# Patient Record
Sex: Female | Born: 1965 | Race: Black or African American | Hispanic: No | Marital: Single | Smoking: Never smoker
Health system: Southern US, Community
[De-identification: ages and names within clinical notes are randomized; demographics above are authoritative.]

## PROBLEM LIST (undated history)

## (undated) DIAGNOSIS — I10 Essential (primary) hypertension: Secondary | ICD-10-CM

## (undated) DIAGNOSIS — E079 Disorder of thyroid, unspecified: Secondary | ICD-10-CM

## (undated) DIAGNOSIS — C801 Malignant (primary) neoplasm, unspecified: Secondary | ICD-10-CM

## (undated) HISTORY — PX: THYROID SURGERY: SHX805

## (undated) HISTORY — PX: ABDOMINAL HYSTERECTOMY: SHX81

---

## 1998-04-27 ENCOUNTER — Emergency Department (HOSPITAL_COMMUNITY): Admission: EM | Admit: 1998-04-27 | Discharge: 1998-04-27 | Payer: Self-pay | Admitting: Emergency Medicine

## 2001-05-12 ENCOUNTER — Emergency Department (HOSPITAL_COMMUNITY): Admission: EM | Admit: 2001-05-12 | Discharge: 2001-05-12 | Payer: Self-pay | Admitting: *Deleted

## 2001-05-12 ENCOUNTER — Encounter: Payer: Self-pay | Admitting: *Deleted

## 2005-04-25 ENCOUNTER — Emergency Department (HOSPITAL_COMMUNITY): Admission: EM | Admit: 2005-04-25 | Discharge: 2005-04-25 | Payer: Self-pay | Admitting: Emergency Medicine

## 2015-10-07 ENCOUNTER — Encounter (HOSPITAL_BASED_OUTPATIENT_CLINIC_OR_DEPARTMENT_OTHER): Payer: Self-pay | Admitting: Emergency Medicine

## 2015-10-07 ENCOUNTER — Emergency Department (HOSPITAL_BASED_OUTPATIENT_CLINIC_OR_DEPARTMENT_OTHER)

## 2015-10-07 ENCOUNTER — Emergency Department (HOSPITAL_BASED_OUTPATIENT_CLINIC_OR_DEPARTMENT_OTHER)
Admission: EM | Admit: 2015-10-07 | Discharge: 2015-10-07 | Disposition: A | Discharge status: Home / Self Care | Attending: Emergency Medicine | Admitting: Emergency Medicine

## 2015-10-07 DIAGNOSIS — Y9389 Activity, other specified: Secondary | ICD-10-CM | POA: Insufficient documentation

## 2015-10-07 DIAGNOSIS — S300XXA Contusion of lower back and pelvis, initial encounter: Secondary | ICD-10-CM | POA: Diagnosis not present

## 2015-10-07 DIAGNOSIS — W010XXA Fall on same level from slipping, tripping and stumbling without subsequent striking against object, initial encounter: Secondary | ICD-10-CM | POA: Insufficient documentation

## 2015-10-07 DIAGNOSIS — Y999 Unspecified external cause status: Secondary | ICD-10-CM | POA: Insufficient documentation

## 2015-10-07 DIAGNOSIS — Z79899 Other long term (current) drug therapy: Secondary | ICD-10-CM | POA: Insufficient documentation

## 2015-10-07 DIAGNOSIS — Y929 Unspecified place or not applicable: Secondary | ICD-10-CM | POA: Insufficient documentation

## 2015-10-07 DIAGNOSIS — I10 Essential (primary) hypertension: Secondary | ICD-10-CM | POA: Insufficient documentation

## 2015-10-07 DIAGNOSIS — Z859 Personal history of malignant neoplasm, unspecified: Secondary | ICD-10-CM | POA: Diagnosis not present

## 2015-10-07 DIAGNOSIS — S3992XA Unspecified injury of lower back, initial encounter: Secondary | ICD-10-CM | POA: Diagnosis present

## 2015-10-07 HISTORY — DX: Disorder of thyroid, unspecified: E07.9

## 2015-10-07 HISTORY — DX: Malignant (primary) neoplasm, unspecified: C80.1

## 2015-10-07 MED ORDER — NAPROXEN 500 MG PO TABS: 500.0000 mg | ORAL_TABLET | Freq: Two times a day (BID) | ORAL | Status: DC

## 2015-10-07 MED ORDER — CYCLOBENZAPRINE HCL 10 MG PO TABS: 10.0000 mg | ORAL_TABLET | Freq: Two times a day (BID) | ORAL | Status: DC | PRN

## 2015-10-07 MED ORDER — IBUPROFEN 800 MG PO TABS
800.0000 mg | ORAL_TABLET | Freq: Once | ORAL | Status: AC
  Administered 2015-10-07: 800 mg via ORAL
  Filled 2015-10-07: qty 1

## 2015-10-07 MED FILL — NAPROXEN 500 MG TABLET: 500 | 15 days supply | Qty: 30 | Fill #0

## 2015-10-07 MED FILL — CYCLOBENZAPRINE 10 MG TAB: 10 | 10 days supply | Qty: 20 | Fill #0

## 2015-10-07 NOTE — Discharge Instructions (Signed)
Contusion Follow up with your worker's comp doctor. Your blood pressure was elevated today. Return to the ED if you develop new or worsening symptoms. A contusion is a deep bruise. Contusions are the result of a blunt injury to tissues and muscle fibers under the skin. The injury causes bleeding under the skin. The skin overlying the contusion may turn blue, purple, or yellow. Minor injuries will give you a painless contusion, but more severe contusions may stay painful and swollen for a few weeks.  CAUSES  This condition is usually caused by a blow, trauma, or direct force to an area of the body. SYMPTOMS  Symptoms of this condition include:  Swelling of the injured area.  Pain and tenderness in the injured area.  Discoloration. The area may have redness and then turn blue, purple, or yellow. DIAGNOSIS  This condition is diagnosed based on a physical exam and medical history. An X-ray, CT scan, or MRI may be needed to determine if there are any associated injuries, such as broken bones (fractures). TREATMENT  Specific treatment for this condition depends on what area of the body was injured. In general, the best treatment for a contusion is resting, icing, applying pressure to (compression), and elevating the injured area. This is often called the RICE strategy. Over-the-counter anti-inflammatory medicines may also be recommended for pain control.  HOME CARE INSTRUCTIONS   Rest the injured area.  If directed, apply ice to the injured area:  Put ice in a plastic bag.  Place a towel between your skin and the bag.  Leave the ice on for 20 minutes, 2-3 times per day.  If directed, apply light compression to the injured area using an elastic bandage. Make sure the bandage is not wrapped too tightly. Remove and reapply the bandage as directed by your health care provider.  If possible, raise (elevate) the injured area above the level of your heart while you are sitting or lying  down.  Take over-the-counter and prescription medicines only as told by your health care provider. SEEK MEDICAL CARE IF:  Your symptoms do not improve after several days of treatment.  Your symptoms get worse.  You have difficulty moving the injured area. SEEK IMMEDIATE MEDICAL CARE IF:   You have severe pain.  You have numbness in a hand or foot.  Your hand or foot turns pale or cold.   This information is not intended to replace advice given to you by your health care provider. Make sure you discuss any questions you have with your health care provider.   Document Released: 03/01/2005 Document Revised: 02/10/2015 Document Reviewed: 10/07/2014 Elsevier Interactive Patient Education [employer].

## 2015-10-07 NOTE — ED Notes (Signed)
Dr Wyvonnia Dusky in room with pt and RN now.
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
## 2017-03-23 ENCOUNTER — Emergency Department (HOSPITAL_BASED_OUTPATIENT_CLINIC_OR_DEPARTMENT_OTHER)
Admission: EM | Admit: 2017-03-23 | Discharge: 2017-03-23 | Disposition: A | Discharge status: Home / Self Care | Attending: Emergency Medicine | Admitting: Emergency Medicine

## 2017-03-23 ENCOUNTER — Emergency Department (HOSPITAL_BASED_OUTPATIENT_CLINIC_OR_DEPARTMENT_OTHER)

## 2017-03-23 ENCOUNTER — Encounter (HOSPITAL_BASED_OUTPATIENT_CLINIC_OR_DEPARTMENT_OTHER): Payer: Self-pay | Admitting: *Deleted

## 2017-03-23 DIAGNOSIS — Y999 Unspecified external cause status: Secondary | ICD-10-CM | POA: Insufficient documentation

## 2017-03-23 DIAGNOSIS — I10 Essential (primary) hypertension: Secondary | ICD-10-CM | POA: Insufficient documentation

## 2017-03-23 DIAGNOSIS — W231XXA Caught, crushed, jammed, or pinched between stationary objects, initial encounter: Secondary | ICD-10-CM | POA: Insufficient documentation

## 2017-03-23 DIAGNOSIS — Y939 Activity, unspecified: Secondary | ICD-10-CM | POA: Diagnosis not present

## 2017-03-23 DIAGNOSIS — Y929 Unspecified place or not applicable: Secondary | ICD-10-CM | POA: Diagnosis not present

## 2017-03-23 DIAGNOSIS — S60031A Contusion of right middle finger without damage to nail, initial encounter: Secondary | ICD-10-CM | POA: Diagnosis not present

## 2017-03-23 DIAGNOSIS — Z79899 Other long term (current) drug therapy: Secondary | ICD-10-CM | POA: Insufficient documentation

## 2017-03-23 DIAGNOSIS — S6981XA Other specified injuries of right wrist, hand and finger(s), initial encounter: Secondary | ICD-10-CM | POA: Diagnosis present

## 2017-03-23 DIAGNOSIS — S6000XA Contusion of unspecified finger without damage to nail, initial encounter: Secondary | ICD-10-CM

## 2017-03-23 HISTORY — DX: Essential (primary) hypertension: I10

## 2017-03-23 MED ORDER — NAPROXEN 250 MG PO TABS
500.0000 mg | ORAL_TABLET | Freq: Once | ORAL | Status: AC
  Administered 2017-03-23: 500 mg via ORAL
  Filled 2017-03-23: qty 2

## 2017-03-23 MED ORDER — NAPROXEN 500 MG PO TABS: 500.0000 mg | ORAL_TABLET | Freq: Two times a day (BID) | ORAL | 0 refills | Status: DC

## 2017-03-23 MED ORDER — ACETAMINOPHEN 500 MG PO TABS
1000.0000 mg | ORAL_TABLET | Freq: Once | ORAL | Status: AC
  Administered 2017-03-23: 1000 mg via ORAL
  Filled 2017-03-23: qty 2

## 2017-03-23 NOTE — ED Triage Notes (Signed)
Mashed middle finger between 2 pc of metal  Increased swelling w bruising

## 2017-03-23 NOTE — ED Provider Notes (Signed)
[place] EMERGENCY DEPARTMENT Provider Note   CSN: [id number] Arrival date & time: 03/23/17  2595     History   Chief Complaint Chief Complaint  Patient presents with  . Finger Injury    HPI Debra Watson is a 51 y.o. female.  The history is provided by the patient.  Hand Pain  This is a new problem. The current episode started 3 to 5 hours ago. The problem occurs constantly. The problem has not changed since onset.Pertinent negatives include no chest pain, no abdominal pain, no headaches and no shortness of breath. Nothing aggravates the symptoms. Nothing relieves the symptoms. She has tried nothing for the symptoms. The treatment provided no relief.    Past Medical History:  Diagnosis Date  . Cancer ([place])   . Hypertension   . Thyroid disease     There are no active problems to display for this patient.   Past Surgical History:  Procedure Laterality Date  . ABDOMINAL HYSTERECTOMY    . THYROID SURGERY      OB History    No data available       Home Medications    Prior to Admission medications   Medication Sig Start Date End Date Taking? Authorizing Provider  hydrochlorothiazide (HYDRODIURIL) 12.5 MG tablet Take 12.5 mg by mouth daily.   Yes [provider]  cholecalciferol (VITAMIN D) 1000 units tablet Take 1,000 Units by mouth daily.    [provider]  cyclobenzaprine (FLEXERIL) 10 MG tablet Take 1 tablet (10 mg total) by mouth 2 (two) times daily as needed for muscle spasms. 10/07/15   Rancour, Annie Main, MD  estrogens conjugated, synthetic A, (CENESTIN) 0.3 MG tablet Take 0.3 mg by mouth daily.    [provider]  fluticasone (FLONASE) 50 MCG/ACT nasal spray Place 1 spray into both nostrils daily.    [provider]  levothyroxine (SYNTHROID, LEVOTHROID) 100 MCG tablet Take 100 mcg by mouth daily before breakfast.    [provider]  naproxen (NAPROSYN) 500 MG tablet Take 1 tablet (500 mg total) by  mouth 2 (two) times daily. 03/23/17   Annalis Kaczmarczyk, MD  omeprazole (PRILOSEC) 10 MG capsule Take 10 mg by mouth daily.    [provider]    Family History No family history on file.  Social History Social History  Substance Use Topics  . Smoking status: Never Smoker  . Smokeless tobacco: Never Used  . Alcohol use No     Allergies   Penicillins   Review of Systems Review of Systems  Respiratory: Negative for shortness of breath.   Cardiovascular: Negative for chest pain.  Gastrointestinal: Negative for abdominal pain.  Musculoskeletal: Positive for arthralgias.  Neurological: Negative for headaches.  All other systems reviewed and are negative.    Physical Exam Updated Vital Signs BP (!) 184/115 (BP Location: Left Arm)   Pulse 80   Temp 97.8 F (36.6 C) (Oral)   Resp 20   Ht 5' 1.5" (1.562 m)   Wt 64.4 kg (142 lb)   SpO2 100%   BMI 26.40 kg/m   Physical Exam  Constitutional: She is oriented to person, place, and time. She appears well-developed and well-nourished. No distress.  HENT:  Head: Normocephalic and atraumatic.  Eyes: EOM are normal.  Neck: Normal range of motion.  Cardiovascular: Normal rate, regular rhythm, normal heart sounds and intact distal pulses.   Pulmonary/Chest: Effort normal and breath sounds normal. No respiratory distress. She has no wheezes. She has no  rales.  Abdominal: Soft. Bowel sounds are normal. There is no tenderness.  Musculoskeletal: Normal range of motion.       Right wrist: Normal.       Right hand: She exhibits normal range of motion, no tenderness, no bony tenderness, normal two-point discrimination, normal capillary refill, no deformity, no laceration and no swelling. Normal sensation noted. Normal strength noted. She exhibits no finger abduction and no thumb/finger opposition.  Neurological: She is alert and oriented to person, place, and time.  Skin: Skin is warm and dry.     ED Treatments / Results    Vitals:   03/23/17 0535  BP: (!) 184/115  Pulse: 80  Resp: 20  Temp: 97.8 F (36.6 C)  SpO2: 100%    Radiology Dg Finger Middle Right  Result Date: 03/23/2017 CLINICAL DATA:  50 y/o  F; crush injury with pain. EXAM: RIGHT MIDDLE FINGER 2+V COMPARISON:  None. FINDINGS: There is no evidence of fracture or dislocation. There is no evidence of arthropathy or other focal bone abnormality. Soft tissues are unremarkable. IMPRESSION: Negative. Electronically Signed   By: Kristine Garbe M.D.   On: 03/23/2017 06:18    Procedures Procedures (including critical care time)  Medications Ordered in ED Medications  acetaminophen (TYLENOL) tablet 1,000 mg (1,000 mg Oral Given 03/23/17 0630)  naproxen (NAPROSYN) tablet 500 mg (500 mg Oral Given 03/23/17 0630)    Splinted for comfort,   Final Clinical Impressions(s) / ED Diagnoses   Final diagnoses:  Contusion of finger of right hand, unspecified finger, initial encounter  Follow up with occupational medicine for ongoing care.  Note given for restrictions x 2 days. Ice 20 minutes every 2 hours, elevation, NSAIDs.    Strict return precautions given for  Shortness of breath, swelling or the lips or tongue, chest pain, dyspnea on exertion, new weakness or numbness changes in vision or speech,  Inability to tolerate liquids or food, changes in voice cough, altered mental status or any concerns. No signs of systemic illness or infection. The patient is nontoxic-appearing on exam and vital signs are within normal limits.    I have reviewed the triage vital signs and the nursing notes. Pertinent labs &imaging results that were available during my care of the patient were reviewed by me and considered in my medical decision making (see chart for details).  After history, exam, and medical workup I feel the patient has been appropriately medically screened and is safe for discharge home. Pertinent diagnoses were discussed with the  patient. Patient was given return precautions.  New Prescriptions New Prescriptions   NAPROXEN (NAPROSYN) 500 MG TABLET    Take 1 tablet (500 mg total) by mouth 2 (two) times daily.     Han Vejar, MD 03/23/17 [phone]

## 2017-03-23 NOTE — ED Notes (Signed)
Ice applied

## 2017-04-10 ENCOUNTER — Ambulatory Visit

## 2017-04-10 ENCOUNTER — Other Ambulatory Visit: Payer: Self-pay | Admitting: Occupational Medicine

## 2017-04-10 DIAGNOSIS — M79644 Pain in right finger(s): Secondary | ICD-10-CM

## 2019-08-01 IMAGING — DX DG FINGER MIDDLE 2+V*R*
3 series · 3 of 3 positions shown · non-contrast
Comparison: 03/23/2017

CLINICAL DATA: Crush injury to the right middle finger and
03/23/2017 presenting with slight ulnar deviation of the distal
phalanx. No pain or discomfort presently.

EXAM:
RIGHT MIDDLE FINGER 2+V

[finger pa]
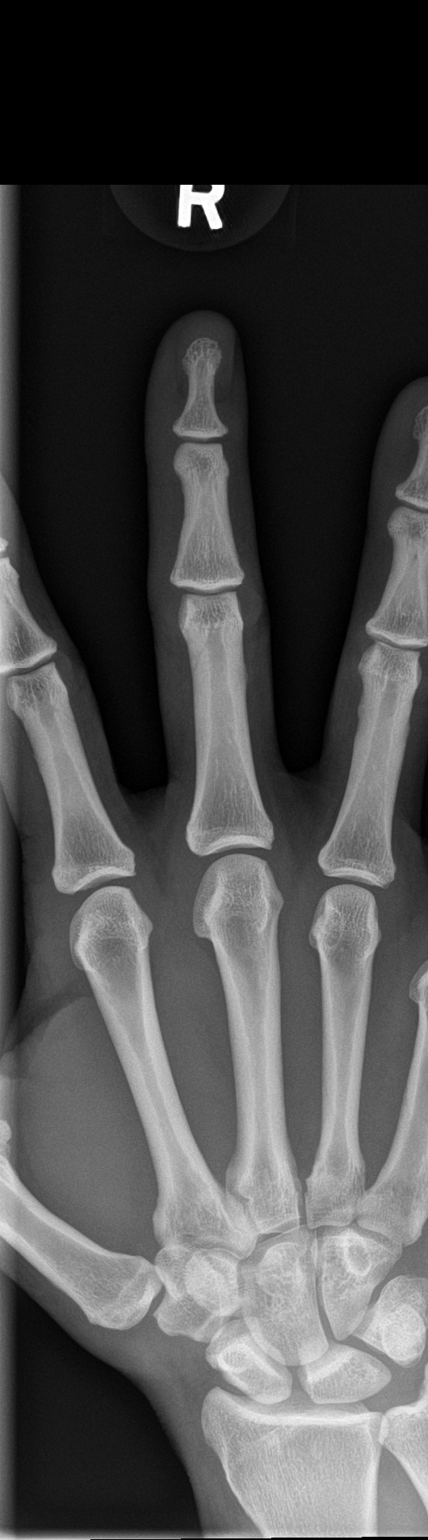

[finger obl]
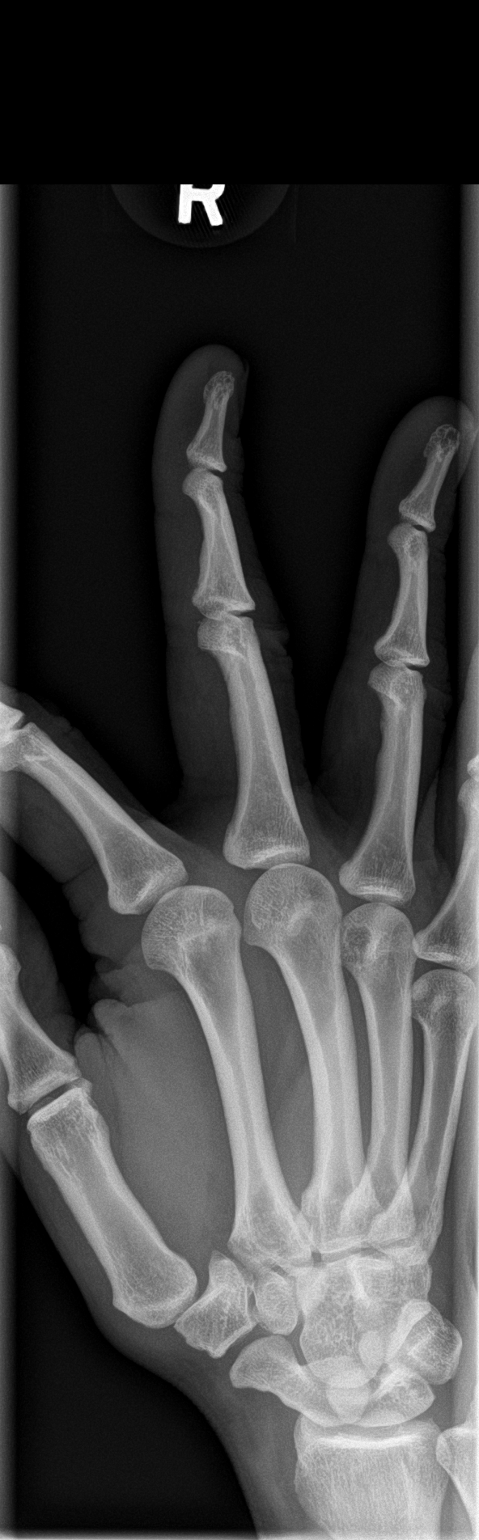

[finger lat]
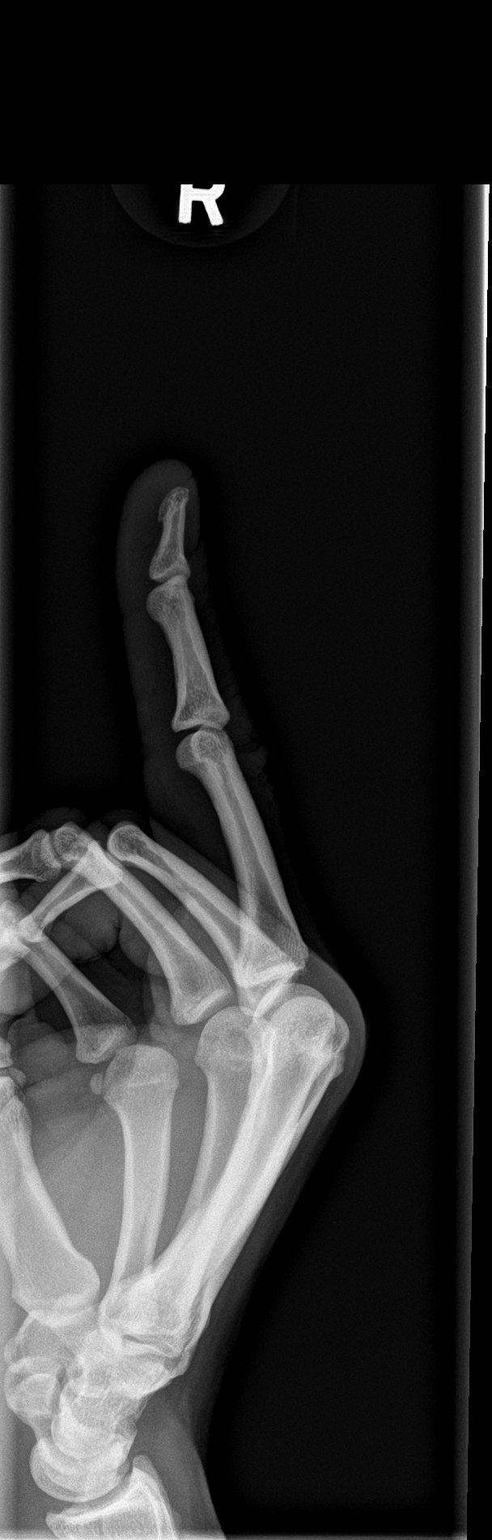

[3 of 3 positions shown; findings below may reference images not displayed]

FINDINGS: No significant change in the appearance of the left middle finger.
Joint spaces are intact. No acute displaced fracture or suspicious
osseous lesion is noted.
IMPRESSION: No acute radiographic abnormality of the left middle finger.

## 2021-10-07 ENCOUNTER — Ambulatory Visit
Admission: RE | Admit: 2021-10-07 | Discharge: 2021-10-07 | Disposition: A | Discharge status: Home / Self Care | Payer: Federal, State, Local not specified - PPO | Source: Ambulatory Visit | Attending: Emergency Medicine | Admitting: Emergency Medicine

## 2021-10-07 VITALS — BP 128/85 | HR 70 | Temp 98.3°F

## 2021-10-07 DIAGNOSIS — N760 Acute vaginitis: Secondary | ICD-10-CM

## 2021-10-07 DIAGNOSIS — Z113 Encounter for screening for infections with a predominantly sexual mode of transmission: Secondary | ICD-10-CM | POA: Insufficient documentation

## 2021-10-07 LAB — POCT URINALYSIS DIP (MANUAL ENTRY)
Bilirubin, UA: NEGATIVE
Blood, UA: NEGATIVE
Glucose, UA: NEGATIVE mg/dL
Ketones, POC UA: NEGATIVE mg/dL
Leukocytes, UA: NEGATIVE
Nitrite, UA: NEGATIVE
Protein Ur, POC: NEGATIVE mg/dL
Spec Grav, UA: 1.02 (ref 1.010–1.025)
Urobilinogen, UA: 2 E.U./dL — AB
pH, UA: 7 (ref 5.0–8.0)

## 2021-10-07 MED ORDER — TERCONAZOLE 0.8 % VA CREA: TOPICAL_CREAM | VAGINAL | 1 refills | Status: AC

## 2021-10-07 NOTE — ED Triage Notes (Signed)
Pt c/o vaginal irritation for a week.  ?

## 2021-10-07 NOTE — ED Provider Notes (Addendum)
?[facility] ? ? ? ?CSN: [id number] ?Arrival date & time: 10/07/21  1247 ?  ? ?HISTORY  ? ?Chief Complaint  ?Patient presents with  ? Urinary Frequency  ?  Irritation, around  vaginal area - Entered by patient  ? ?HPI ?Debra Watson is a 56 y.o. female. Patient complains of vaginal irritation for the past week.  Patient states she has recently had sex with a new partner, is requesting STD screening.  Patient denies vaginal discharge, states that when she urinates the urine burns her vaginal skin because it is so irritated.  Patient states she is also had a lot of itching.  Patient states has not tried anything for relief of her symptoms. ? ?The history is provided by the patient.  ?Past Medical History:  ?Diagnosis Date  ? Cancer [facility])   ? Hypertension   ? Thyroid disease   ? ?There are no problems to display for this patient. ? ?Past Surgical History:  ?Procedure Laterality Date  ? ABDOMINAL HYSTERECTOMY    ? THYROID SURGERY    ? ?OB History   ?No obstetric history on file. ?  ? ?Home Medications   ? ?Prior to Admission medications   ?Medication Sig Start Date End Date Taking? Authorizing Provider  ?levothyroxine (SYNTHROID, LEVOTHROID) 100 MCG tablet Take 100 mcg by mouth daily before breakfast.    [provider]  ? ?Family History ?History reviewed. No pertinent family history. ?Social History ?Social History  ? ?Tobacco Use  ? Smoking status: Never  ? Smokeless tobacco: Never  ?Substance Use Topics  ? Alcohol use: No  ? Drug use: No  ? ?Allergies   ?Shellfish-derived products, Orange oil, Tomato, Yellow dye, and Penicillins ? ?Review of Systems ?Review of Systems ?Pertinent findings noted in history of present illness.  ? ?Physical Exam ?Triage Vital Signs ?ED Triage Vitals  ?Enc Vitals Group  ?   BP 04/01/21 0827 (!) 147/82  ?   Pulse Rate 04/01/21 0827 72  ?   Resp 04/01/21 0827 18  ?   Temp 04/01/21 0827 98.3 ?F (36.8 ?C)  ?   Temp Source 04/01/21 0827 Oral  ?   SpO2 04/01/21 0827 98 %  ?    Weight --   ?   Height --   ?   Head Circumference --   ?   Peak Flow --   ?   Pain Score 04/01/21 0826 5  ?   Pain Loc --   ?   Pain Edu? --   ?   Excl. in [place]? --   ?No data found. ? ?Updated Vital Signs ?BP 128/85 (BP Location: Left Arm)   Pulse 70   Temp 98.3 ?F (36.8 ?C) (Oral)   SpO2 98%  ? ?Physical Exam ?Vitals and nursing note reviewed.  ?Constitutional:   ?   General: She is not in acute distress. ?   Appearance: Normal appearance. She is not ill-appearing.  ?HENT:  ?   Head: Normocephalic and atraumatic.  ?Eyes:  ?   General: Lids are normal.     ?   Right eye: No discharge.     ?   Left eye: No discharge.  ?   Extraocular Movements: Extraocular movements intact.  ?   Conjunctiva/sclera: Conjunctivae normal.  ?   Right eye: Right conjunctiva is not injected.  ?   Left eye: Left conjunctiva is not injected.  ?Neck:  ?   Trachea: Trachea and phonation normal.  ?Cardiovascular:  ?  Rate and Rhythm: Normal rate and regular rhythm.  ?   Pulses: Normal pulses.  ?   Heart sounds: Normal heart sounds. No murmur heard. ?  No friction rub. No gallop.  ?Pulmonary:  ?   Effort: Pulmonary effort is normal. No accessory muscle usage, prolonged expiration or respiratory distress.  ?   Breath sounds: Normal breath sounds. No stridor, decreased air movement or transmitted upper airway sounds. No decreased breath sounds, wheezing, rhonchi or rales.  ?Chest:  ?   Chest wall: No tenderness.  ?Abdominal:  ?   General: Abdomen is flat. Bowel sounds are normal. There is no distension.  ?   Palpations: Abdomen is soft. There is no mass.  ?   Tenderness: There is no abdominal tenderness. There is no right CVA tenderness, left CVA tenderness, guarding or rebound.  ?   Hernia: No hernia is present.  ?Genitourinary: ?   Comments: Patient politely declines pelvic exam today, patient provided a vaginal swab for testing. ?Musculoskeletal:     ?   General: Normal range of motion.  ?   Cervical back: Normal range of motion and neck  supple. Normal range of motion.  ?Lymphadenopathy:  ?   Cervical: No cervical adenopathy.  ?Skin: ?   General: Skin is warm and dry.  ?   Findings: No erythema or rash.  ?Neurological:  ?   General: No focal deficit present.  ?   Mental Status: She is alert and oriented to person, place, and time.  ?Psychiatric:     ?   Mood and Affect: Mood normal.     ?   Behavior: Behavior normal.  ? ? ?Visual Acuity ?Right Eye Distance:   ?Left Eye Distance:   ?Bilateral Distance:   ? ?Right Eye Near:   ?Left Eye Near:    ?Bilateral Near:    ? ?UC Couse / Diagnostics / Procedures:  ?  ?EKG ? ?Radiology ?No results found. ? ?Procedures ?Procedures (including critical care time) ? ?UC Diagnoses / Final Clinical Impressions(s)   ?I have reviewed the triage vital signs and the nursing notes. ? ?Pertinent labs & imaging results that were available during my care of the patient were reviewed by me and considered in my medical decision making (see chart for details).   ? ?Final diagnoses:  ?Vulvovaginitis  ? ?Patient was provided with a prescription of terconazole cream to apply externally until the results of her vaginal swab has been complete. ?  ?Patient was advised to abstain from sexual intercourse for the next 7 days while being treated.  Patient was also advised to use condoms to protect themselves from STD exposure. ?  ?STD screening was performed, patient advised that the results be posted to their MyChart and if any of the results are positive, they will be notified by phone, further treatment will be provided as indicated based on results of STD screening. ?  ?Urine dip today was positive for normal.  Urine culture will be performed per our protocol.   ? ?Return precautions advised.  Drug allergies reviewed, all questions addressed.  ? ? ? ?ED Prescriptions   ? ? Medication Sig Dispense Auth. Provider  ? terconazole (TERAZOL 3) 0.8 % vaginal cream Apply externally 3-4 times daily as needed for redness and irritation in the  vaginal and vulvar area. 20 g Lynden Oxford Scales, PA-C  ? ?  ? ?PDMP not reviewed this encounter. ? ?Pending results:  ?Labs Reviewed  ?POCT URINALYSIS DIP (MANUAL ENTRY) -  Abnormal; Notable for the following components:  ?    Result Value  ? Urobilinogen, UA 2.0 (*)   ? All other components within normal limits  ?CERVICOVAGINAL ANCILLARY ONLY  ? ? ?Medications Ordered in UC: ?Medications - No data to display ? ?Disposition Upon Discharge:  ?Condition: stable for discharge home ? ?Patient presented with concern for an acute illness with associated systemic symptoms and significant discomfort requiring urgent management. In my opinion, this is a condition that a prudent lay person (someone who possesses an average knowledge of health and medicine) may potentially expect to result in complications if not addressed urgently such as respiratory distress, impairment of bodily function or dysfunction of bodily organs.  ? ?As such, the patient has been evaluated and assessed, work-up was performed and treatment was provided in alignment with urgent care protocols and evidence based medicine.  Patient/parent/caregiver has been advised that the patient may require follow up for further testing and/or treatment if the symptoms continue in spite of treatment, as clinically indicated and appropriate. ? ?Routine symptom specific, illness specific and/or disease specific instructions were discussed with the patient and/or caregiver at length.  Prevention strategies for avoiding STD exposure were also discussed. ? ?The patient will follow up with their current PCP if and as advised. If the patient does not currently have a PCP we will assist them in obtaining one.  ? ?The patient may need specialty follow up if the symptoms continue, in spite of conservative treatment and management, for further workup, evaluation, consultation and treatment as clinically indicated and appropriate. ? ?Patient/parent/caregiver verbalized  understanding and agreement of plan as discussed.  All questions were addressed during visit.  Please see discharge instructions below for further details of plan. ? ?Discharge Instructions: ? ? ?Discharge Instr

## 2021-10-07 NOTE — Discharge Instructions (Signed)
The results of your vaginal swab test today will be made available to you once they are complete, this typically takes 3 to 5 days.  They will initially be posted to your MyChart and, if any of your results are abnormal, you will receive a phone call with those results along with further instructions regarding treatment and any prescriptions, if needed.  ? ?While you are waiting for the results of your test, please feel free to use terconazole cream which I prescribed for you.  Terconazole is an antifungal medication that is used to treat yeast infections and highly effective at soothing skin irritations due to the presence of yeast.  This cream can be applied to the external vaginal area as well as the internal vaginal area 3-4 times daily for relief of your irritation, itching and burning. ? ?Your urinalysis today is normal.  I am not concerned that you have a urinary tract infection at this time. ?  ?If you have not had complete resolution of your symptoms after completing treatment, please return for repeat evaluation. ?  ?Thank you for visiting urgent care today.  I appreciate the opportunity to participate in your care. ? ?

## 2021-10-10 LAB — CERVICOVAGINAL ANCILLARY ONLY
Bacterial Vaginitis (gardnerella): NEGATIVE
Candida Glabrata: NEGATIVE
Candida Vaginitis: NEGATIVE
Chlamydia: NEGATIVE
Comment: NEGATIVE
Comment: NEGATIVE
Comment: NEGATIVE
Comment: NEGATIVE
Comment: NEGATIVE
Comment: NORMAL
Neisseria Gonorrhea: NEGATIVE
Trichomonas: NEGATIVE

## 2022-01-25 ENCOUNTER — Encounter (HOSPITAL_COMMUNITY): Payer: Self-pay

## 2022-01-25 ENCOUNTER — Ambulatory Visit (HOSPITAL_COMMUNITY)
Admission: EM | Admit: 2022-01-25 | Discharge: 2022-01-25 | Disposition: A | Discharge status: Home / Self Care | Payer: Federal, State, Local not specified - PPO | Attending: Family Medicine | Admitting: Family Medicine

## 2022-01-25 DIAGNOSIS — M5432 Sciatica, left side: Secondary | ICD-10-CM | POA: Diagnosis not present

## 2022-01-25 DIAGNOSIS — M25552 Pain in left hip: Secondary | ICD-10-CM

## 2022-01-25 MED ORDER — CYCLOBENZAPRINE HCL 10 MG PO TABS: 10.0000 mg | ORAL_TABLET | Freq: Two times a day (BID) | ORAL | 0 refills | Status: AC | PRN

## 2022-01-25 MED ORDER — KETOROLAC TROMETHAMINE 30 MG/ML IJ SOLN
INTRAMUSCULAR | Status: AC
  Filled 2022-01-25: qty 1

## 2022-01-25 MED ORDER — DEXAMETHASONE SODIUM PHOSPHATE 10 MG/ML IJ SOLN
INTRAMUSCULAR | Status: AC
  Filled 2022-01-25: qty 1

## 2022-01-25 MED ORDER — DEXAMETHASONE SODIUM PHOSPHATE 10 MG/ML IJ SOLN
10.0000 mg | Freq: Once | INTRAMUSCULAR | Status: AC
  Administered 2022-01-25: 10 mg via INTRAMUSCULAR

## 2022-01-25 MED ORDER — PREDNISONE 20 MG PO TABS: 40.0000 mg | ORAL_TABLET | Freq: Every day | ORAL | 0 refills | Status: AC

## 2022-01-25 MED ORDER — KETOROLAC TROMETHAMINE 30 MG/ML IJ SOLN
30.0000 mg | Freq: Once | INTRAMUSCULAR | Status: AC
Start: 2022-01-25 — End: 2022-01-25
  Administered 2022-01-25: 30 mg via INTRAMUSCULAR

## 2022-01-25 NOTE — ED Provider Notes (Signed)
[facility]    CSN: [id number] Arrival date & time: 01/25/22  1954      History   Chief Complaint Chief Complaint  Patient presents with   Hip Pain    HPI Debra Watson is a 56 y.o. female.   HPI Patient presents for evaluation of left hip pain and symptoms developed upon awakening.  Denies injuries. She has an extensive history of lumbar disc disease.  However reports that her symptoms are normally localized to the right side.  She has taken Tylenol without any relief of pain.  She reports she is out of her cyclobenzaprine which she has been prescribed previously for back pain.  She denies any injury or any recent straining injury.  The pain is localized at her left hip and glutes and is non-radiating.   Past Medical History:  Diagnosis Date   Cancer [facility])    Hypertension    Thyroid disease     There are no problems to display for this patient.   Past Surgical History:  Procedure Laterality Date   ABDOMINAL HYSTERECTOMY     THYROID SURGERY      OB History   No obstetric history on file.      Home Medications    Prior to Admission medications   Medication Sig Start Date End Date Taking? Authorizing Provider  cyclobenzaprine (FLEXERIL) 10 MG tablet Take 1 tablet (10 mg total) by mouth 2 (two) times daily as needed for muscle spasms. 01/25/22  Yes Scot Jun, FNP  predniSONE (DELTASONE) 20 MG tablet Take 2 tablets (40 mg total) by mouth daily with breakfast. 01/25/22  Yes Scot Jun, FNP  albuterol (VENTOLIN HFA) 108 (90 Base) MCG/ACT inhaler TAKE 2 PUFFS BY MOUTH EVERY 4 HOURS AS NEEDED 01/18/18   [provider]  clobetasol ointment (TEMOVATE) 0.05 % Apply to worst areas daily x 2 weeks. 11/06/16   [provider]  diclofenac (VOLTAREN) 75 MG EC tablet Take by mouth. 02/03/20   [provider]  Ergocalciferol 50 MCG (2000 UT) TABS Take by mouth.    [provider]  estradiol (ESTRACE) 1 MG tablet Take by  mouth. 05/23/18   [provider]  hydrochlorothiazide (MICROZIDE) 12.5 MG capsule Take by mouth. 03/29/16   [provider]  ketoconazole (NIZORAL) 2 % shampoo Apply topically. 11/06/16   [provider]  levothyroxine (SYNTHROID, LEVOTHROID) 100 MCG tablet Take 100 mcg by mouth daily before breakfast.    [provider]  lubiprostone (AMITIZA) 24 MCG capsule TAKE 1 CAPSULE TWICE DAILY WITH MEALS 11/25/19   [provider]  omeprazole (PRILOSEC) 40 MG capsule SMARTSIG:1 Capsule(s) By Mouth Morning-Night 04/29/21   [provider]  SUMAtriptan (IMITREX) 25 MG tablet TAKE 1 TABLET AT THE FIRST SIGN OF MIGRAINE. MAY REPEAT ONCE IN TWO HOURS IF HEADACHE RECURS. 12/21/17   [provider]  SYNTHROID 137 MCG tablet Take 137 mcg by mouth daily. 09/24/21   [provider]  terconazole (TERAZOL 3) 0.8 % vaginal cream Apply externally 3-4 times daily as needed for redness and irritation in the vaginal and vulvar area. 10/07/21   Lynden Oxford Scales, PA-C  valACYclovir (VALTREX) 1000 MG tablet Take tablets twice daily for 1 day with the onset of the symptoms as needed for the cold sores of the lips. 06/10/12   [provider]    Family History History reviewed. No pertinent family history.  Social History Social History   Tobacco Use   Smoking  status: Never   Smokeless tobacco: Never  Substance Use Topics   Alcohol use: No   Drug use: No     Allergies   Shellfish-derived products, Orange oil, Tomato, Yellow dye, and Penicillins   Review of Systems Review of Systems Pertinent negatives listed in HPI  Physical Exam Triage Vital Signs ED Triage Vitals [01/25/22 2004]  Enc Vitals Group     BP (!) 159/104     Pulse Rate 79     Resp 18     Temp (!) 97.4 F (36.3 C)     Temp Source Oral     SpO2 99 %     Weight      Height      Head Circumference      Peak Flow      Pain Score 10     Pain Loc      Pain Edu?       Excl. in [place]?    No data found.  Updated Vital Signs BP (!) 159/104 (BP Location: Left Arm)   Pulse 79   Temp (!) 97.4 F (36.3 C) (Oral)   Resp 18   SpO2 99%   Visual Acuity Right Eye Distance:   Left Eye Distance:   Bilateral Distance:    Right Eye Near:   Left Eye Near:    Bilateral Near:     Physical Exam Vitals and nursing note reviewed.  Constitutional:      General: She is not in acute distress.    Appearance: She is well-developed.  HENT:     Head: Normocephalic and atraumatic.  Eyes:     Conjunctiva/sclera: Conjunctivae normal.  Cardiovascular:     Rate and Rhythm: Normal rate and regular rhythm.     Heart sounds: No murmur heard. Pulmonary:     Effort: Pulmonary effort is normal. No respiratory distress.     Breath sounds: Normal breath sounds.  Musculoskeletal:        General: No swelling.     Cervical back: Normal range of motion and neck supple.       Back:  Skin:    General: Skin is warm and dry.     Capillary Refill: Capillary refill takes less than 2 seconds.  Neurological:     Mental Status: She is alert.  Psychiatric:        Mood and Affect: Mood normal.      UC Treatments / Results  Labs (all labs ordered are listed, but only abnormal results are displayed) Labs Reviewed - No data to display  EKG   Radiology No results found.  Procedures Procedures (including critical care time)  Medications Ordered in UC Medications  ketorolac (TORADOL) 30 MG/ML injection 30 mg (has no administration in time range)  dexamethasone (DECADRON) injection 10 mg (has no administration in time range)    Initial Impression / Assessment and Plan / UC Course  I have reviewed the triage vital signs and the nursing notes.  Pertinent labs & imaging results that were available during my care of the patient were reviewed by me and considered in my medical decision making (see chart for details).    Left hip pain with sciatica of the left  side Decadron and Toradol given here in clinic. Patient will continue management of symptoms with oral prednisone. Cyclobenzaprine as needed.  Advised to follow-up with spine specialist if symptoms do not readily improve. Final Clinical Impressions(s) / UC Diagnoses   Final diagnoses:  Left hip pain  Sciatica of left side     Discharge Instructions      Start prednisone tomorrow morning. Take cyclobenzaprine up to twice daily as needed for pain.  Keep in mind this medication can cause severe drowsiness.  Continue heat compresses.  If symptoms do not readily improve follow-up with your spine specialist.   ED Prescriptions     Medication Sig Dispense Auth. Provider   cyclobenzaprine (FLEXERIL) 10 MG tablet Take 1 tablet (10 mg total) by mouth 2 (two) times daily as needed for muscle spasms. 20 tablet Scot Jun, FNP   predniSONE (DELTASONE) 20 MG tablet Take 2 tablets (40 mg total) by mouth daily with breakfast. 10 tablet Scot Jun, FNP      PDMP not reviewed this encounter.   Scot Jun, [place] 01/25/22 2035

## 2022-01-25 NOTE — ED Triage Notes (Signed)
Pt c/o rt hip pain x1 day. Denies injuries. States took tylenol with no relief. Denies pain radiating.

## 2022-01-25 NOTE — Discharge Instructions (Signed)
Start prednisone tomorrow morning. Take cyclobenzaprine up to twice daily as needed for pain.  Keep in mind this medication can cause severe drowsiness.  Continue heat compresses.  If symptoms do not readily improve follow-up with your spine specialist.
